# Patient Record
Sex: Female | Born: 1960 | Race: Black or African American | Hispanic: No | Marital: Single | Smoking: Never smoker
Health system: Southern US, Community
[De-identification: ages and names within clinical notes are randomized; demographics above are authoritative.]

## PROBLEM LIST (undated history)

## (undated) HISTORY — PX: TONSILLECTOMY: SUR1361

---

## 2014-10-27 ENCOUNTER — Encounter (HOSPITAL_BASED_OUTPATIENT_CLINIC_OR_DEPARTMENT_OTHER): Payer: Self-pay

## 2014-10-27 ENCOUNTER — Emergency Department (HOSPITAL_BASED_OUTPATIENT_CLINIC_OR_DEPARTMENT_OTHER)
Admission: EM | Admit: 2014-10-27 | Discharge: 2014-10-27 | Disposition: A | Discharge status: Home / Self Care | Payer: No Typology Code available for payment source | Attending: Emergency Medicine | Admitting: Emergency Medicine

## 2014-10-27 ENCOUNTER — Emergency Department (HOSPITAL_BASED_OUTPATIENT_CLINIC_OR_DEPARTMENT_OTHER): Payer: No Typology Code available for payment source

## 2014-10-27 DIAGNOSIS — Y92410 Unspecified street and highway as the place of occurrence of the external cause: Secondary | ICD-10-CM | POA: Diagnosis not present

## 2014-10-27 DIAGNOSIS — S39012A Strain of muscle, fascia and tendon of lower back, initial encounter: Secondary | ICD-10-CM | POA: Diagnosis not present

## 2014-10-27 DIAGNOSIS — Y998 Other external cause status: Secondary | ICD-10-CM | POA: Insufficient documentation

## 2014-10-27 DIAGNOSIS — Y9389 Activity, other specified: Secondary | ICD-10-CM | POA: Diagnosis not present

## 2014-10-27 DIAGNOSIS — S3992XA Unspecified injury of lower back, initial encounter: Secondary | ICD-10-CM | POA: Diagnosis present

## 2014-10-27 MED ORDER — TRAMADOL HCL 50 MG PO TABS: 50.0000 mg | ORAL_TABLET | Freq: Four times a day (QID) | ORAL | Status: DC | PRN

## 2014-10-27 MED ORDER — TRAMADOL HCL 50 MG PO TABS
50.0000 mg | ORAL_TABLET | Freq: Once | ORAL | Status: AC
  Administered 2014-10-27: 50 mg via ORAL
  Filled 2014-10-27: qty 1

## 2014-10-27 NOTE — Discharge Instructions (Signed)
Back Pain, Adult Low back pain is very common. About 1 in 5 people have back pain.The cause of low back pain is rarely dangerous. The pain often gets better over time.About half of people with a sudden onset of back pain feel better in just 2 weeks. About 8 in 10 people feel better by 6 weeks.  CAUSES Some common causes of back pain include:  Strain of the muscles or ligaments supporting the spine.  Wear and tear (degeneration) of the spinal discs.  Arthritis.  Direct injury to the back. DIAGNOSIS Most of the time, the direct cause of low back pain is not known.However, back pain can be treated effectively even when the exact cause of the pain is unknown.Answering your caregiver's questions about your overall health and symptoms is one of the most accurate ways to make sure the cause of your pain is not dangerous. If your caregiver needs more information, he or she may order lab work or imaging tests (X-rays or MRIs).However, even if imaging tests show changes in your back, this usually does not require surgery. HOME CARE INSTRUCTIONS For many people, back pain returns.Since low back pain is rarely dangerous, it is often a condition that people can learn to manageon their own.   Remain active. It is stressful on the back to sit or stand in one place. Do not sit, drive, or stand in one place for more than 30 minutes at a time. Take short walks on level surfaces as soon as pain allows.Try to increase the length of time you walk each day.  Do not stay in bed.Resting more than 1 or 2 days can delay your recovery.  Do not avoid exercise or work.Your body is made to move.It is not dangerous to be active, even though your back may hurt.Your back will likely heal faster if you return to being active before your pain is gone.  Pay attention to your body when you bend and lift. Many people have less discomfortwhen lifting if they bend their knees, keep the load close to their bodies,and  avoid twisting. Often, the most comfortable positions are those that put less stress on your recovering back.  Find a comfortable position to sleep. Use a firm mattress and lie on your side with your knees slightly bent. If you lie on your back, put a pillow under your knees.  Only take over-the-counter or prescription medicines as directed by your caregiver. Over-the-counter medicines to reduce pain and inflammation are often the most helpful.Your caregiver may prescribe muscle relaxant drugs.These medicines help dull your pain so you can more quickly return to your normal activities and healthy exercise.  Put ice on the injured area.  Put ice in a plastic bag.  Place a towel between your skin and the bag.  Leave the ice on for 15-20 minutes, 03-04 times a day for the first 2 to 3 days. After that, ice and heat may be alternated to reduce pain and spasms.  Ask your caregiver about trying back exercises and gentle massage. This may be of some benefit.  Avoid feeling anxious or stressed.Stress increases muscle tension and can worsen back pain.It is important to recognize when you are anxious or stressed and learn ways to manage it.Exercise is a great option. SEEK MEDICAL CARE IF:  You have pain that is not relieved with rest or medicine.  You have pain that does not improve in 1 week.  You have new symptoms.  You are generally not feeling well. SEEK   IMMEDIATE MEDICAL CARE IF:   You have pain that radiates from your back into your legs.  You develop new bowel or bladder control problems.  You have unusual weakness or numbness in your arms or legs.  You develop nausea or vomiting.  You develop abdominal pain.  You feel faint. Document Released: 07/11/2005 Document Revised: 01/10/2012 Document Reviewed: 11/12/2013 [employer] Patient Information 2015 [employer]. This information is not intended to replace advice given to you by your health care provider. Make sure you  discuss any questions you have with your health care provider.  
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
## 2014-12-15 ENCOUNTER — Encounter (HOSPITAL_BASED_OUTPATIENT_CLINIC_OR_DEPARTMENT_OTHER): Payer: Self-pay | Admitting: *Deleted

## 2014-12-15 ENCOUNTER — Emergency Department (HOSPITAL_BASED_OUTPATIENT_CLINIC_OR_DEPARTMENT_OTHER)
Admission: EM | Admit: 2014-12-15 | Discharge: 2014-12-15 | Disposition: A | Discharge status: Home / Self Care | Payer: No Typology Code available for payment source | Attending: Emergency Medicine | Admitting: Emergency Medicine

## 2014-12-15 DIAGNOSIS — S3992XA Unspecified injury of lower back, initial encounter: Secondary | ICD-10-CM | POA: Insufficient documentation

## 2014-12-15 DIAGNOSIS — Y9389 Activity, other specified: Secondary | ICD-10-CM | POA: Diagnosis not present

## 2014-12-15 DIAGNOSIS — Y92410 Unspecified street and highway as the place of occurrence of the external cause: Secondary | ICD-10-CM | POA: Diagnosis not present

## 2014-12-15 DIAGNOSIS — M546 Pain in thoracic spine: Secondary | ICD-10-CM

## 2014-12-15 DIAGNOSIS — Y998 Other external cause status: Secondary | ICD-10-CM | POA: Diagnosis not present

## 2014-12-15 MED ORDER — IBUPROFEN 600 MG PO TABS: 600.0000 mg | ORAL_TABLET | Freq: Three times a day (TID) | ORAL | Status: AC | PRN

## 2014-12-15 MED ORDER — CYCLOBENZAPRINE HCL 10 MG PO TABS
10.0000 mg | ORAL_TABLET | Freq: Three times a day (TID) | ORAL | Status: AC | PRN
Start: 2014-12-15 — End: ?

## 2014-12-15 MED ORDER — IBUPROFEN 400 MG PO TABS
600.0000 mg | ORAL_TABLET | Freq: Once | ORAL | Status: AC
  Administered 2014-12-15: 600 mg via ORAL
  Filled 2014-12-15 (×2): qty 1
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
## 2023-06-12 ENCOUNTER — Ambulatory Visit: Payer: Self-pay | Admitting: Endocrinology

## 2023-07-03 ENCOUNTER — Encounter: Payer: Self-pay | Admitting: Physician Assistant
